# Patient Record
Sex: Male | Born: 2005 | Race: Black or African American | Hispanic: No | Marital: Single | Smoking: Never smoker
Health system: Southern US, Community
[De-identification: ages and names within clinical notes are randomized; demographics above are authoritative.]

## PROBLEM LIST (undated history)

## (undated) DIAGNOSIS — F909 Attention-deficit hyperactivity disorder, unspecified type: Secondary | ICD-10-CM

---
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
## 2023-11-27 ENCOUNTER — Ambulatory Visit: Admission: EM | Admit: 2023-11-27 | Discharge: 2023-11-27 | Disposition: A | Discharge status: Home / Self Care

## 2023-11-27 ENCOUNTER — Telehealth: Payer: Self-pay

## 2023-11-27 ENCOUNTER — Ambulatory Visit (INDEPENDENT_AMBULATORY_CARE_PROVIDER_SITE_OTHER)

## 2023-11-27 DIAGNOSIS — J209 Acute bronchitis, unspecified: Secondary | ICD-10-CM

## 2023-11-27 DIAGNOSIS — R0989 Other specified symptoms and signs involving the circulatory and respiratory systems: Secondary | ICD-10-CM | POA: Diagnosis not present

## 2023-11-27 MED ORDER — IPRATROPIUM-ALBUTEROL 0.5-2.5 (3) MG/3ML IN SOLN: 3.0000 mL | Freq: Four times a day (QID) | RESPIRATORY_TRACT | 0 refills | Status: AC | PRN

## 2023-11-27 MED ORDER — IPRATROPIUM-ALBUTEROL 0.5-2.5 (3) MG/3ML IN SOLN: 3.0000 mL | Freq: Four times a day (QID) | RESPIRATORY_TRACT | 0 refills | Status: DC | PRN

## 2023-11-27 MED ORDER — AZELASTINE HCL 0.1 % NA SOLN: 1.0000 | Freq: Two times a day (BID) | NASAL | 1 refills | Status: DC

## 2023-11-27 MED ORDER — PROMETHAZINE-DM 6.25-15 MG/5ML PO SYRP: 5.0000 mL | ORAL_SOLUTION | Freq: Four times a day (QID) | ORAL | 0 refills | Status: DC | PRN

## 2023-11-27 MED ORDER — PREDNISONE 20 MG PO TABS: 40.0000 mg | ORAL_TABLET | Freq: Every day | ORAL | 0 refills | Status: AC

## 2023-11-27 MED ORDER — PROMETHAZINE-DM 6.25-15 MG/5ML PO SYRP: 5.0000 mL | ORAL_SOLUTION | Freq: Four times a day (QID) | ORAL | 0 refills | Status: AC | PRN

## 2023-11-27 MED ORDER — PREDNISONE 20 MG PO TABS: 40.0000 mg | ORAL_TABLET | Freq: Every day | ORAL | 0 refills | Status: DC

## 2023-11-27 MED ORDER — VENTOLIN HFA 108 (90 BASE) MCG/ACT IN AERS: 1.0000 | INHALATION_SPRAY | Freq: Four times a day (QID) | RESPIRATORY_TRACT | 1 refills | Status: DC | PRN

## 2023-11-27 MED ORDER — IPRATROPIUM-ALBUTEROL 0.5-2.5 (3) MG/3ML IN SOLN
3.0000 mL | Freq: Once | RESPIRATORY_TRACT | Status: AC
  Administered 2023-11-27: 3 mL via RESPIRATORY_TRACT

## 2023-11-27 MED ORDER — ALBUTEROL SULFATE HFA 108 (90 BASE) MCG/ACT IN AERS: 1.0000 | INHALATION_SPRAY | Freq: Four times a day (QID) | RESPIRATORY_TRACT | 1 refills | Status: AC | PRN

## 2023-11-27 MED ORDER — AZELASTINE HCL 0.1 % NA SOLN: 1.0000 | Freq: Two times a day (BID) | NASAL | 1 refills | Status: AC

## 2023-11-27 NOTE — ED Provider Notes (Signed)
 [facility]  Note:  This document was prepared using Conservation officer, historic buildings and may include unintentional dictation errors.  MRN: [id number] DOB: 2006/04/07  Subjective:   DERYK BOZMAN is a 18 y.o. male presenting for cough, nasal congestion, sore throat x 2 to 3 days.  Patient denies any known sick contacts.  Has not taken any over-the-counter medication at home to treat symptoms.  No chest pain, shortness of breath, weakness, dizziness.  Mother reports that he had asthma more as a child but has not had any problems for several years.  He did have a nebulizer machine but has not used it in many years and has been misplaced.  No current facility-administered medications for this encounter.  Current Outpatient Medications:    albuterol (VENTOLIN HFA) 108 (90 Base) MCG/ACT inhaler, Inhale 1-2 puffs into the lungs every 6 (six) hours as needed for wheezing or shortness of breath., Disp: 18 g, Rfl: 1   amphetamine-dextroamphetamine (ADDERALL) 20 MG tablet, Take 20 mg by mouth 3 (three) times daily., Disp: , Rfl:    azelastine (ASTELIN) 0.1 % nasal spray, Place 1 spray into both nostrils 2 (two) times daily. Use in each nostril as directed, Disp: 30 mL, Rfl: 1   cloNIDine (CATAPRES) 0.2 MG tablet, Take 0.2 mg by mouth daily., Disp: , Rfl:    ipratropium-albuterol (DUONEB) 0.5-2.5 (3) MG/3ML SOLN, Take 3 mLs by nebulization every 6 (six) hours as needed., Disp: 360 mL, Rfl: 0   predniSONE (DELTASONE) 20 MG tablet, Take 2 tablets (40 mg total) by mouth daily for 5 days., Disp: 10 tablet, Rfl: 0   promethazine-dextromethorphan (PROMETHAZINE-DM) 6.25-15 MG/5ML syrup, Take 5 mLs by mouth 4 (four) times daily as needed for cough., Disp: 118 mL, Rfl: 0   sertraline (ZOLOFT) 50 MG tablet, Take 50 mg by mouth daily., Disp: , Rfl:    Allergies  Allergen Reactions   Penicillins Swelling    Past Medical History:  Diagnosis Date   ADHD      History reviewed. No pertinent  surgical history.  History reviewed. No pertinent family history.  Social History   Tobacco Use   Smoking status: Never   Smokeless tobacco: Never  Vaping Use   Vaping status: Some Days    ROS Refer to HPI for ROS details.  Objective:   Vitals: BP 121/73 (BP Location: Right Arm)   Pulse 74   Temp 99 F (37.2 C) (Oral)   Resp 19   SpO2 97%   Physical Exam Vitals and nursing note reviewed.  Constitutional:      General: He is not in acute distress.    Appearance: He is well-developed. He is not ill-appearing or toxic-appearing.  HENT:     Head: Normocephalic.     Nose: Congestion and rhinorrhea present.     Mouth/Throat:     Mouth: Mucous membranes are moist.     Pharynx: Oropharynx is clear. No oropharyngeal exudate or posterior oropharyngeal erythema.  Eyes:     General:        Right eye: No discharge.        Left eye: No discharge.     Extraocular Movements: Extraocular movements intact.     Conjunctiva/sclera: Conjunctivae normal.  Cardiovascular:     Rate and Rhythm: Normal rate and regular rhythm.     Heart sounds: Normal heart sounds. No murmur heard. Pulmonary:     Effort: Pulmonary effort is normal. No respiratory distress.     Breath sounds: No  stridor. Wheezing and rhonchi present. No rales.  Skin:    General: Skin is warm and dry.  Neurological:     General: No focal deficit present.     Mental Status: He is alert and oriented to person, place, and time.  Psychiatric:        Mood and Affect: Mood normal.        Behavior: Behavior normal.     Procedures  No results found for this or any previous visit (from the past 24 hours).  Assessment and Plan :     Discharge Instructions       1. Acute bronchitis, unspecified organism (Primary) - DG Chest 2 View x-ray performed in UC shows no acute cardiopulmonary processes, no sign of consolidation or pneumonia. - ipratropium-albuterol (DUONEB) 0.5-2.5 (3) MG/3ML nebulizer solution 3 mL  completed in UC for acute wheezing.  Patient feels much better after breathing treatment and abnormal breathing sounds on auscultation have improved. - albuterol (VENTOLIN HFA) 108 (90 Base) MCG/ACT inhaler; Inhale 1-2 puffs into the lungs every 6 (six) hours as needed for wheezing or shortness of breath.  Dispense: 18 g; Refill: 1 - predniSONE (DELTASONE) 20 MG tablet; Take 2 tablets (40 mg total) by mouth daily for 5 days.  Dispense: 10 tablet; Refill: 0 - promethazine-dextromethorphan (PROMETHAZINE-DM) 6.25-15 MG/5ML syrup; Take 5 mLs by mouth 4 (four) times daily as needed for cough.  Dispense: 118 mL; Refill: 0 - azelastine (ASTELIN) 0.1 % nasal spray; Place 1 spray into both nostrils 2 (two) times daily. Use in each nostril as directed  Dispense: 30 mL; Refill: 1 - Nebulizer machine and DuoNeb nebulizer solution prescribed for home use for acute asthma exacerbation and/or difficulty breathing. -Continue to monitor symptoms for any change in severity if there is any escalation of current symptoms or development of new symptoms follow-up in ER for further evaluation and management.    Gwynn Chalker B Josuel Koeppen   Laneisha Mino, [place], [place] 11/27/23 [phone]

## 2023-11-27 NOTE — Discharge Instructions (Addendum)
  1. Acute bronchitis, unspecified organism (Primary) - DG Chest 2 View x-ray performed in UC shows no acute cardiopulmonary processes, no sign of consolidation or pneumonia. - ipratropium-albuterol  (DUONEB) 0.5-2.5 (3) MG/3ML nebulizer solution 3 mL completed in UC for acute wheezing.  Patient feels much better after breathing treatment and abnormal breathing sounds on auscultation have improved. - albuterol  (VENTOLIN  HFA) 108 (90 Base) MCG/ACT inhaler; Inhale 1-2 puffs into the lungs every 6 (six) hours as needed for wheezing or shortness of breath.  Dispense: 18 g; Refill: 1 - predniSONE  (DELTASONE ) 20 MG tablet; Take 2 tablets (40 mg total) by mouth daily for 5 days.  Dispense: 10 tablet; Refill: 0 - promethazine -dextromethorphan (PROMETHAZINE -DM) 6.25-15 MG/5ML syrup; Take 5 mLs by mouth 4 (four) times daily as needed for cough.  Dispense: 118 mL; Refill: 0 - azelastine  (ASTELIN ) 0.1 % nasal spray; Place 1 spray into both nostrils 2 (two) times daily. Use in each nostril as directed  Dispense: 30 mL; Refill: 1 - Nebulizer machine and DuoNeb nebulizer solution prescribed for home use for acute asthma exacerbation and/or difficulty breathing. -Continue to monitor symptoms for any change in severity if there is any escalation of current symptoms or development of new symptoms follow-up in ER for further evaluation and management.

## 2023-11-27 NOTE — ED Triage Notes (Signed)
 cough congestion sore throat 2 days
